# Patient Record
Sex: Female | Born: 1955 | Race: White | Hispanic: No | Marital: Single | Smoking: Never smoker
Health system: Southern US, Community
[De-identification: ages and names within clinical notes are randomized; demographics above are authoritative.]

## PROBLEM LIST (undated history)

## (undated) DIAGNOSIS — M810 Age-related osteoporosis without current pathological fracture: Secondary | ICD-10-CM

## (undated) DIAGNOSIS — M1990 Unspecified osteoarthritis, unspecified site: Secondary | ICD-10-CM

## (undated) HISTORY — DX: Age-related osteoporosis without current pathological fracture: M81.0

## (undated) HISTORY — DX: Unspecified osteoarthritis, unspecified site: M19.90

---

## 1998-03-30 ENCOUNTER — Encounter: Payer: Self-pay | Admitting: Obstetrics & Gynecology

## 1998-03-30 ENCOUNTER — Ambulatory Visit (HOSPITAL_COMMUNITY): Admission: RE | Admit: 1998-03-30 | Discharge: 1998-03-30 | Payer: Self-pay | Admitting: Obstetrics & Gynecology

## 1999-03-29 ENCOUNTER — Ambulatory Visit (HOSPITAL_COMMUNITY): Admission: RE | Admit: 1999-03-29 | Discharge: 1999-03-29 | Payer: Self-pay | Admitting: Obstetrics and Gynecology

## 1999-03-29 ENCOUNTER — Encounter: Payer: Self-pay | Admitting: Obstetrics and Gynecology

## 2000-05-08 ENCOUNTER — Other Ambulatory Visit: Admission: RE | Admit: 2000-05-08 | Discharge: 2000-05-08 | Payer: Self-pay | Admitting: Obstetrics and Gynecology

## 2001-07-05 ENCOUNTER — Other Ambulatory Visit: Admission: RE | Admit: 2001-07-05 | Discharge: 2001-07-05 | Payer: Self-pay | Admitting: Obstetrics and Gynecology

## 2002-08-01 ENCOUNTER — Other Ambulatory Visit: Admission: RE | Admit: 2002-08-01 | Discharge: 2002-08-01 | Payer: Self-pay | Admitting: Obstetrics and Gynecology

## 2003-09-11 ENCOUNTER — Other Ambulatory Visit: Admission: RE | Admit: 2003-09-11 | Discharge: 2003-09-11 | Payer: Self-pay | Admitting: Obstetrics and Gynecology

## 2004-11-01 ENCOUNTER — Other Ambulatory Visit: Admission: RE | Admit: 2004-11-01 | Discharge: 2004-11-01 | Payer: Self-pay | Admitting: Obstetrics and Gynecology

## 2005-02-24 HISTORY — PX: TRIGGER FINGER RELEASE: SHX641

## 2005-11-07 ENCOUNTER — Other Ambulatory Visit: Admission: RE | Admit: 2005-11-07 | Discharge: 2005-11-07 | Payer: Self-pay | Admitting: Obstetrics and Gynecology

## 2007-07-29 ENCOUNTER — Encounter: Payer: Self-pay | Admitting: Gastroenterology

## 2007-08-11 ENCOUNTER — Ambulatory Visit: Payer: Self-pay | Admitting: Gastroenterology

## 2007-08-24 ENCOUNTER — Ambulatory Visit: Payer: Self-pay | Admitting: Gastroenterology

## 2007-11-12 ENCOUNTER — Ambulatory Visit: Payer: Self-pay | Admitting: Vascular Surgery

## 2007-12-29 ENCOUNTER — Ambulatory Visit: Payer: Self-pay | Admitting: Vascular Surgery

## 2008-02-16 ENCOUNTER — Ambulatory Visit: Payer: Self-pay | Admitting: Vascular Surgery

## 2010-07-09 NOTE — Assessment & Plan Note (Signed)
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  This  was done on 12/29/2007.  She has a very nice result from this.  She  reports that the pain over these areas has completely resolved.  She has  had a very nice cosmetic result as well with the reticular varicosities  being sclerosed.  She is pleased with her result and will notify us  should she wish any further sclerotherapy treatments.   Larina Earthly, M.D.  Electronically Signed   TFE/MEDQ  D:  02/16/2008  T:  02/17/2008  Job:  [id number]

## 2010-07-09 NOTE — Assessment & Plan Note (Signed)
OFFICE VISIT   JERMIYA, REICHL  DOB:  15-Feb-1956                                       12/29/2007  ZOXWR#:[id number]   The patient presents today for sclerotherapy for painful reticular  varicosities over her left lateral thigh extending down onto her calf.  She had a total of 4 mL solution of 0.3% of sodium tetradecyl mixed half  and half with CO2 over the multiple reticular.  She had a very nice  initial result.  She is instructed on her compression garment use, and  that we will see her again in 3 weeks for continued followup.   Larina Earthly, M.D.  Electronically Signed   TFE/MEDQ  D:  12/29/2007  T:  12/30/2007  Job:  [id number]   cc:   Stacie Acres. Cliffton Asters, M.D.

## 2010-07-09 NOTE — Consult Note (Signed)
NEW PATIENT CONSULTATION   Beth Wood, Beth Wood  DOB:  1955/06/09                                       11/12/2007  ZOXWR#:[id number]   The patient presents today for evaluation of lower extremity  varicosities.  She has a very long family history of severe venous  disease.  She has had this is most pronounced in her mother.  She has an  area over the past year on her posterior left calf and thigh with a  slight reticular bulge.  She reports this causes pain and throbbing over  this area.  She has not had any history of deep venous thrombosis.  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  She is concerned  regarding these reticular varicosities.   PAST HISTORY:  Otherwise benign.  She does not have any history of  diabetes, hypertension or other major medical difficulties.   CURRENT MEDICATIONS:  Yaz, Actonel, and calcium.   She has allergy to penicillin.   PHYSICAL EXAM:  A well-developed thin white female in no acute distress.  She does have 2+ dorsalis pedis pulses bilaterally.  She does have this  area of reticular varicosities in her posterior calf extending up onto  her thigh.   I imaged this with ultrasound.  This does not appear to connect to her  small saphenous vein on great saphenous vein.  Her great saphenous and  small saphenous veins are not dilated.  I discussed the significance of  this at length with the patient.  I explained that with no gross reflux  apparent that I would recommend initially treating this simply with  sclerotherapy to treat the reticular veins and hopefully improve her  symptoms.  Explained this is an outpatient procedure, and if she wished  to proceed with this at her convenience in the next several weeks.   Larina Earthly, M.D.  Electronically Signed   TFE/MEDQ  D:  11/12/2007  T:  11/16/2007  Job:  [id number]

## 2013-11-19 ENCOUNTER — Encounter: Payer: Self-pay | Admitting: Gastroenterology

## 2016-04-28 ENCOUNTER — Ambulatory Visit (INDEPENDENT_AMBULATORY_CARE_PROVIDER_SITE_OTHER): Payer: Self-pay

## 2016-04-28 ENCOUNTER — Encounter (INDEPENDENT_AMBULATORY_CARE_PROVIDER_SITE_OTHER): Payer: Self-pay | Admitting: Orthopedic Surgery

## 2016-04-28 ENCOUNTER — Ambulatory Visit (INDEPENDENT_AMBULATORY_CARE_PROVIDER_SITE_OTHER): Payer: BLUE CROSS/BLUE SHIELD | Admitting: Orthopedic Surgery

## 2016-04-28 VITALS — Ht 61.0 in | Wt 108.0 lb

## 2016-04-28 DIAGNOSIS — M545 Low back pain, unspecified: Secondary | ICD-10-CM | POA: Insufficient documentation

## 2016-04-28 MED ORDER — PREDNISONE 10 MG PO TABS: 20.0000 mg | ORAL_TABLET | Freq: Every day | ORAL | 0 refills | Status: AC

## 2016-04-28 NOTE — Progress Notes (Signed)
Office Visit Note   Patient: Beth Wood           Date of Birth: 05/23/1955           MRN: [id number] Visit Date: 04/28/2016              Requested by: No referring provider defined for this encounter. PCP: No PCP Per Patient  Chief Complaint  Patient presents with  . Lower Back - Pain    HPI: Patient is a 61 y.o female who presents for acute lower back pain ongoing for approximately two weeks. She has pain at the center of her back squatting down trying to clean her bathroom. She started doing exercises she had learned from physical therapy and taking meloxicam and was feeling better. A couple days later patient was raising her leg and now states she can hardly walk. She has pain with walking and laying down. She is an employee at [employer] and she is constantly up and down the ladder. She had to leave work due to her pain. She has pain with start up. She denies numbness and tingling, or radicular pain. Maxcine Ham, RT    Assessment & Plan: Visit Diagnoses:  1. Acute midline low back pain without sciatica     Plan: We'll start her on prednisone 20 mg a day she'll discontinue her anti-inflammatories. Taken prednisone in the morning. Recommended heat with either ThermaCare or heating pad to her lumbar spine. Patient is unable to lift or climb ladders that is required for work and we will take her out of work for at least a week we may adjust this pain and how she resolves her symptoms. There are no radicular symptoms do not feel an MRI scan is needed at this time.  Follow-Up Instructions: Return in about 3 weeks (around 05/19/2016).   Ortho Exam Examination patient is alert oriented no adenopathy well-dressed normal affect normal respiratory effort she has a normal gait. Patient has difficulty getting from a sitting to standing position. She has negative straight leg raise bilaterally good motor strength in all motor groups of both lower extremities. She is tender to  palpation the paraspinous muscles worse on the right side. Her MRI scan was reviewed from 2013 which showed no focal herniated disc with some mild bulging disks. Patient is most tender to palpation in the region of L4-5 paraspinous muscle region ROS: Complete review of systems negative except as noted in the history of present illness Imaging: Xr Lumbar Spine 2-3 Views  Result Date: 04/28/2016 Two-view radiographs lumbar spine shows no evidence of scoliosis she has good disc space the pedicles are symmetrical the foramen are open she does have some mild degenerative changes. There is a normal lordosis.   Labs: No results found for: HGBA1C, ESRSEDRATE, CRP, LABURIC, REPTSTATUS, GRAMSTAIN, CULT, LABORGA  Orders:  Orders Placed This Encounter  Procedures  . XR Lumbar Spine 2-3 Views   Meds ordered this encounter  Medications  . predniSONE (DELTASONE) 10 MG tablet    Sig: Take 2 tablets (20 mg total) by mouth daily with breakfast.    Dispense:  60 tablet    Refill:  0     Procedures: No procedures performed  Clinical Data: No additional findings.  Subjective: Review of Systems  Objective: Vital Signs: Ht 5\' 1"  (1.549 m)   Wt 108 lb (49 kg)   BMI 20.41 kg/m   Specialty Comments:  No specialty comments available.  PMFS History: Patient Active Problem  List   Diagnosis Date Noted  . Acute midline low back pain without sciatica 04/28/2016   History reviewed. No pertinent past medical history.  History reviewed. No pertinent family history.  History reviewed. No pertinent surgical history. Social History   Occupational History  . Not on file.   Social History Main Topics  . Smoking status: Never Smoker  . Smokeless tobacco: Never Used  . Alcohol use Not on file  . Drug use: Unknown  . Sexual activity: Not on file

## 2016-05-19 ENCOUNTER — Encounter (INDEPENDENT_AMBULATORY_CARE_PROVIDER_SITE_OTHER): Payer: Self-pay | Admitting: Orthopedic Surgery

## 2016-05-19 ENCOUNTER — Ambulatory Visit (INDEPENDENT_AMBULATORY_CARE_PROVIDER_SITE_OTHER): Payer: BLUE CROSS/BLUE SHIELD | Admitting: Orthopedic Surgery

## 2016-05-19 DIAGNOSIS — M545 Low back pain, unspecified: Secondary | ICD-10-CM

## 2016-05-20 NOTE — Progress Notes (Signed)
   Office Visit Note   Patient: Beth Wood           Date of Birth: [birth year]           MRN: [id number] Visit Date: 05/19/2016              Requested by: No referring provider defined for this encounter. PCP: No PCP Per Patient  Chief Complaint  Patient presents with  . Lower Back - Follow-up    HPI: Patient is a 61 year old woman seen today in follow up for right sided low back pain. Pain is middle of lumbar area in muscles. Is aggravated by movement. Feeling much better. Has improved but is not gone. Has been taking prednisone, states has been tapering dose. Has been able to return to work. Feels the thermacare is helpful. Pain worse with first awakening, stiffness.   Assessment & Plan: Visit Diagnoses:  1. Acute midline low back pain without sciatica     Plan: will continue with conservative measures, heat and ibu. Taper off prednisone. Follow up in office as needed.   Follow-Up Instructions: Return if symptoms worsen or fail to improve.   Physical Exam  Constitutional: Appears well-developed.  Head: Normocephalic.  Eyes: EOM are normal.  Neck: Normal range of motion.  Cardiovascular: Normal rate.   Pulmonary/Chest: Effort normal.  Neurological: Is alert.  Skin: Skin is warm.  Psychiatric: Has a normal mood and affect. Steady gait.  Back Exam   Tenderness  The patient is experiencing tenderness in the lumbar (no spinous process tenderness).  Tests  Straight leg raise right: negative Straight leg raise left: negative    paraspinous on right  Imaging: No results found.  Labs: No results found for: HGBA1C, ESRSEDRATE, CRP, LABURIC, REPTSTATUS, GRAMSTAIN, CULT, LABORGA  Orders:  No orders of the defined types were placed in this encounter.  No orders of the defined types were placed in this encounter.    Procedures: No procedures performed  Clinical Data: No additional findings.  ROS: Review of Systems  Musculoskeletal: Positive for back  pain. Negative for gait problem.  Neurological: Negative for weakness and numbness.    Objective: Vital Signs: There were no vitals taken for this visit.  Specialty Comments:  No specialty comments available.  PMFS History: Patient Active Problem List   Diagnosis Date Noted  . Acute midline low back pain without sciatica 04/28/2016   History reviewed. No pertinent past medical history.  History reviewed. No pertinent family history.  History reviewed. No pertinent surgical history. Social History   Occupational History  . Not on file.   Social History Main Topics  . Smoking status: Never Smoker  . Smokeless tobacco: Never Used  . Alcohol use Not on file  . Drug use: Unknown  . Sexual activity: Not on file

## 2016-06-13 DIAGNOSIS — M810 Age-related osteoporosis without current pathological fracture: Secondary | ICD-10-CM | POA: Diagnosis not present

## 2016-07-10 DIAGNOSIS — Z0000 Encounter for general adult medical examination without abnormal findings: Secondary | ICD-10-CM | POA: Diagnosis not present

## 2016-07-10 DIAGNOSIS — Z130 Encounter for screening for diseases of the blood and blood-forming organs and certain disorders involving the immune mechanism: Secondary | ICD-10-CM | POA: Diagnosis not present

## 2016-07-10 DIAGNOSIS — Z01419 Encounter for gynecological examination (general) (routine) without abnormal findings: Secondary | ICD-10-CM | POA: Diagnosis not present

## 2016-07-10 DIAGNOSIS — Z1389 Encounter for screening for other disorder: Secondary | ICD-10-CM | POA: Diagnosis not present

## 2016-07-10 DIAGNOSIS — Z1231 Encounter for screening mammogram for malignant neoplasm of breast: Secondary | ICD-10-CM | POA: Diagnosis not present

## 2016-07-10 DIAGNOSIS — Z6820 Body mass index (BMI) 20.0-20.9, adult: Secondary | ICD-10-CM | POA: Diagnosis not present

## 2016-08-25 ENCOUNTER — Encounter: Payer: Self-pay | Admitting: Physician Assistant

## 2016-08-25 ENCOUNTER — Ambulatory Visit (INDEPENDENT_AMBULATORY_CARE_PROVIDER_SITE_OTHER): Payer: BLUE CROSS/BLUE SHIELD

## 2016-08-25 ENCOUNTER — Ambulatory Visit (INDEPENDENT_AMBULATORY_CARE_PROVIDER_SITE_OTHER): Payer: BLUE CROSS/BLUE SHIELD | Admitting: Physician Assistant

## 2016-08-25 VITALS — BP 123/74 | HR 63 | Temp 98.4°F | Resp 16 | Ht 61.0 in | Wt 107.2 lb

## 2016-08-25 DIAGNOSIS — M19041 Primary osteoarthritis, right hand: Secondary | ICD-10-CM | POA: Diagnosis not present

## 2016-08-25 DIAGNOSIS — M7989 Other specified soft tissue disorders: Secondary | ICD-10-CM

## 2016-08-25 MED ORDER — COLCHICINE 0.6 MG PO TABS: ORAL_TABLET | ORAL | 1 refills | Status: DC

## 2016-08-25 MED ORDER — SULFAMETHOXAZOLE-TRIMETHOPRIM 800-160 MG PO TABS: 1.0000 | ORAL_TABLET | Freq: Two times a day (BID) | ORAL | 0 refills | Status: DC

## 2016-08-25 NOTE — Patient Instructions (Signed)
     IF you received an x-ray today, you will receive an invoice from [facility]. Please contact [facility] at [phone] with questions or concerns regarding your invoice.   IF you received labwork today, you will receive an invoice from [facility]. Please contact [facility] at [phone] with questions or concerns regarding your invoice.   Our billing staff will not be able to assist you with questions regarding bills from these companies.  You will be contacted with the lab results as soon as they are available. The fastest way to get your results is to activate your My Chart account. Instructions are located on the last page of this paperwork. If you have not heard from us regarding the results in 2 weeks, please contact this office.     

## 2016-08-25 NOTE — Progress Notes (Signed)
    08/25/2016 6:28 PM   DOB: 03-04-1955 / MRN: [id number]  SUBJECTIVE:  Beth Wood is a 61 y.o. female presenting for a swollen and painful right thumb.  Tells me this started about three weeks ago and the problem is waxing and waning.  Associates swelling of the thumb.  She denies weakness of the joint. Tell me the thumb feels like "it has a fever."  She is allergic to penicillins.   She  has no past medical history on file.    She  reports that she has never smoked. She has never used smokeless tobacco. She  has no sexual activity history on file. The patient  has no past surgical history on file.  Her family history is not on file.  Review of Systems  Constitutional: Negative for chills, diaphoresis and fever.  Eyes: Negative.   Gastrointestinal: Negative for nausea.  Skin: Negative for rash.  Neurological: Negative for dizziness, sensory change, speech change, focal weakness and headaches.    The problem list and medications were reviewed and updated by myself where necessary and exist elsewhere in the encounter.   OBJECTIVE:  BP 123/74 (BP Location: Right Arm, Patient Position: Sitting, Cuff Size: Normal)   Pulse 63   Temp 98.4 F (36.9 C) (Oral)   Resp 16   Ht 5\' 1"  (1.549 m)   Wt 107 lb 3.2 oz (48.6 kg)   SpO2 99%   BMI 20.26 kg/m   Physical Exam  Constitutional: She is active.  Non-toxic appearance.  Cardiovascular: Normal rate.   Pulmonary/Chest: Effort normal. No tachypnea.  Musculoskeletal: She exhibits tenderness (About right thumb DIP.  Postive for swelling and mild redness.).  Neurological: She is alert.  Skin: Skin is warm and dry. She is not diaphoretic. No pallor.    No results found for this or any previous visit (from the past 72 hour(s)).  No results found.  ASSESSMENT AND PLAN:  Beth Wood was seen today for hand pain.  Diagnoses and all orders for this visit:  Swelling of right thumb: Gout vs. Infection vs other rheumatological  manifestation.  Will try colchine and abx and sed rate is pending.  -     DG Finger Thumb Right; Future -     Basic metabolic panel -     CBC with Differential -     colchicine 0.6 MG tablet; Take 2 tabs once then 1 tab an hour later. -     Uric Acid -     sulfamethoxazole-trimethoprim (BACTRIM DS,SEPTRA DS) 800-160 MG tablet; Take 1 tablet by mouth 2 (two) times daily. -     Sedimentation Rate    The patient is advised to call or return to clinic if she does not see an improvement in symptoms, or to seek the care of the closest emergency department if she worsens with the above plan.   Philis Fendt, MHS, PA-C [facility] 08/25/2016 6:28 PM

## 2016-08-26 LAB — CBC WITH DIFFERENTIAL/PLATELET
BASOS ABS: 0 10*3/uL (ref 0.0–0.2)
Basos: 1 %
EOS (ABSOLUTE): 0.2 10*3/uL (ref 0.0–0.4)
Eos: 3 %
HEMOGLOBIN: 13.5 g/dL (ref 11.1–15.9)
Hematocrit: 40.5 % (ref 34.0–46.6)
IMMATURE GRANS (ABS): 0 10*3/uL (ref 0.0–0.1)
IMMATURE GRANULOCYTES: 0 %
LYMPHS: 38 %
Lymphocytes Absolute: 2.7 10*3/uL (ref 0.7–3.1)
MCH: 31 pg (ref 26.6–33.0)
MCHC: 33.3 g/dL (ref 31.5–35.7)
MCV: 93 fL (ref 79–97)
MONOCYTES: 10 %
Monocytes Absolute: 0.7 10*3/uL (ref 0.1–0.9)
NEUTROS ABS: 3.5 10*3/uL (ref 1.4–7.0)
NEUTROS PCT: 48 %
Platelets: 326 10*3/uL (ref 150–379)
RBC: 4.35 x10E6/uL (ref 3.77–5.28)
RDW: 12.7 % (ref 12.3–15.4)
WBC: 7.2 10*3/uL (ref 3.4–10.8)

## 2016-08-26 LAB — BASIC METABOLIC PANEL
BUN/Creatinine Ratio: 22 (ref 12–28)
BUN: 13 mg/dL (ref 8–27)
CALCIUM: 9.6 mg/dL (ref 8.7–10.3)
CHLORIDE: 100 mmol/L (ref 96–106)
CO2: 23 mmol/L (ref 20–29)
CREATININE: 0.58 mg/dL (ref 0.57–1.00)
GFR calc Af Amer: 116 mL/min/{1.73_m2} (ref >59)
GFR calc non Af Amer: 101 mL/min/{1.73_m2} (ref >59)
GLUCOSE: 84 mg/dL (ref 65–99)
Potassium: 4.4 mmol/L (ref 3.5–5.2)
Sodium: 142 mmol/L (ref 134–144)

## 2016-08-26 LAB — URIC ACID: URIC ACID: 4 mg/dL (ref 2.5–7.1)

## 2016-08-26 LAB — SEDIMENTATION RATE: SED RATE: 14 mm/h (ref 0–40)

## 2016-08-26 NOTE — Progress Notes (Signed)
Please make patient aware of results via letter. In the context of her overall presentation any abnormal values are of no clinical significance.  Philis Fendt PA-C, 08/26/2016 8:29 AM

## 2016-08-30 ENCOUNTER — Telehealth: Payer: Self-pay | Admitting: Physician Assistant

## 2016-08-30 NOTE — Telephone Encounter (Signed)
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  He did call and advise her of the diagnosis but did not tell her how to treat.  Please advise with further instructions [phone]

## 2016-09-01 NOTE — Telephone Encounter (Signed)
Tylenol 1000 mg is best for maintenance. 200-400 mg ibuprofen on top of the Tylenol is good for break through pain. If the pain pushes through that I can write her tramadol when I get back.

## 2016-09-01 NOTE — Telephone Encounter (Signed)
Please advise 

## 2016-09-01 NOTE — Telephone Encounter (Signed)
Call placed to patient to make her aware of provider response, no answer on patient phone. Left message for patient to return call to this office./ S.Markeem Noreen,CMA

## 2016-09-23 ENCOUNTER — Telehealth: Payer: Self-pay | Admitting: Physician Assistant

## 2016-09-23 DIAGNOSIS — M79646 Pain in unspecified finger(s): Secondary | ICD-10-CM

## 2016-09-23 NOTE — Telephone Encounter (Signed)
Pt called requesting referral for rheumatology for thumb. Pt said she spoke with our office two weeks ago requesting a referral and had not heard back. I do not have any record of speaking with the pt so I am not sure who she spoke to concerning a referral but I do not see a message or referral placed. Please advise. Pt can be contacted at [phone].

## 2016-10-21 DIAGNOSIS — M659 Synovitis and tenosynovitis, unspecified: Secondary | ICD-10-CM | POA: Diagnosis not present

## 2016-10-21 DIAGNOSIS — M79644 Pain in right finger(s): Secondary | ICD-10-CM | POA: Diagnosis not present

## 2017-01-09 ENCOUNTER — Telehealth: Payer: Self-pay | Admitting: Physician Assistant

## 2017-01-09 NOTE — Telephone Encounter (Unsigned)
Copied from [place] [id number]. Topic: Referral - Status >> Jan 09, 2017 12:21 PM Neva Seat wrote: Pt has gotten worse - now it is in pt's other hand.   Pt is still taking anti inflammatory Dr. Jonna Munro prescribed.   Reason for CRM: pt was seen by a orthopedic for her hand, and they wants her referred to a rumatoligist but they need Dr. Carlis Abbott that seen her in August of 2018 to refer her. Please contact pt if you have any questions.

## 2017-01-09 NOTE — Telephone Encounter (Signed)
Please advise 

## 2017-01-19 ENCOUNTER — Telehealth: Payer: Self-pay | Admitting: Physician Assistant

## 2017-01-19 NOTE — Telephone Encounter (Signed)
Pt called stated that her ortho wants her to go to a rheumatologist but she needs a referral for that..  Please advise

## 2017-01-21 NOTE — Telephone Encounter (Signed)
I really don't have any evidence that warrants a rheumtalogical referral. She has OA about the joint she's complaining of and a negative Uric acid and a negative sed rate. I'd be happy to see her back for this but she is already in the hands of one specialist per her report. She should work with them if she wants that referral. I will not be referring.

## 2017-01-22 NOTE — Telephone Encounter (Signed)
Okay I will let the pt know thank you

## 2017-01-22 NOTE — Telephone Encounter (Signed)
Called pt and left a detailed message of what Beth Wood stated and stated that if pt had any questions or concerns to call back.

## 2017-01-22 NOTE — Telephone Encounter (Signed)
Copied from [place] [id number]. Topic: Referral - Status >> Jan 09, 2017 12:21 PM Neva Seat wrote: Pt has gotten worse - now it is in pt's other hand.   Pt is still taking anti inflammatory Dr. Jonna Munro prescribed.   Reason for CRM: pt was seen by a orthopedic for her hand, and they wants her referred to a rumatoligist but they need Dr. Carlis Abbott that seen her in August of 2018 to refer her. Please contact pt if you have any questions. >> Jan 19, 2017 12:28 PM Wynetta Emery, [place] C wrote: PT called in to check the status. Pt says that she has been patiently waiting for a call back . Dr. Carlis Abbott referral area,  please advise.  >> Jan 22, 2017 12:28 PM Robina Ade, Helene Kelp D wrote: Patient called back upset because she feels that she needs a referral to Rheumatology for her hands, they are worse shape. She felt like the provider that saw her did not want to help. Please call patient back, thanks.

## 2017-01-22 NOTE — Telephone Encounter (Signed)
Lets have her come back in. Philis Fendt, MS, PA-C 1:52 PM, 01/22/2017

## 2017-01-23 NOTE — Telephone Encounter (Signed)
Called pt, LMOVM to call and make appt with Philis Fendt PA

## 2017-02-11 ENCOUNTER — Ambulatory Visit
Admission: RE | Admit: 2017-02-11 | Discharge: 2017-02-11 | Disposition: A | Discharge status: Home / Self Care | Payer: BLUE CROSS/BLUE SHIELD | Source: Ambulatory Visit | Attending: Family Medicine | Admitting: Family Medicine

## 2017-02-11 ENCOUNTER — Other Ambulatory Visit: Payer: Self-pay | Admitting: Family Medicine

## 2017-02-11 DIAGNOSIS — M7989 Other specified soft tissue disorders: Secondary | ICD-10-CM | POA: Diagnosis not present

## 2017-02-11 DIAGNOSIS — M189 Osteoarthritis of first carpometacarpal joint, unspecified: Secondary | ICD-10-CM | POA: Diagnosis not present

## 2017-03-30 DIAGNOSIS — M1990 Unspecified osteoarthritis, unspecified site: Secondary | ICD-10-CM | POA: Diagnosis not present

## 2017-03-30 DIAGNOSIS — M06072 Rheumatoid arthritis without rheumatoid factor, left ankle and foot: Secondary | ICD-10-CM | POA: Diagnosis not present

## 2017-03-30 DIAGNOSIS — M79643 Pain in unspecified hand: Secondary | ICD-10-CM | POA: Diagnosis not present

## 2017-03-30 DIAGNOSIS — M7989 Other specified soft tissue disorders: Secondary | ICD-10-CM | POA: Diagnosis not present

## 2017-03-30 DIAGNOSIS — M06071 Rheumatoid arthritis without rheumatoid factor, right ankle and foot: Secondary | ICD-10-CM | POA: Diagnosis not present

## 2017-03-30 DIAGNOSIS — M79673 Pain in unspecified foot: Secondary | ICD-10-CM | POA: Diagnosis not present

## 2017-04-13 DIAGNOSIS — M1990 Unspecified osteoarthritis, unspecified site: Secondary | ICD-10-CM | POA: Diagnosis not present

## 2017-05-14 DIAGNOSIS — M1990 Unspecified osteoarthritis, unspecified site: Secondary | ICD-10-CM | POA: Diagnosis not present

## 2017-06-30 DIAGNOSIS — M79643 Pain in unspecified hand: Secondary | ICD-10-CM | POA: Diagnosis not present

## 2017-06-30 DIAGNOSIS — M79673 Pain in unspecified foot: Secondary | ICD-10-CM | POA: Diagnosis not present

## 2017-06-30 DIAGNOSIS — M7989 Other specified soft tissue disorders: Secondary | ICD-10-CM | POA: Diagnosis not present

## 2017-06-30 DIAGNOSIS — M810 Age-related osteoporosis without current pathological fracture: Secondary | ICD-10-CM | POA: Diagnosis not present

## 2017-06-30 DIAGNOSIS — M1990 Unspecified osteoarthritis, unspecified site: Secondary | ICD-10-CM | POA: Diagnosis not present

## 2017-07-14 ENCOUNTER — Encounter: Payer: Self-pay | Admitting: Gastroenterology

## 2017-07-27 ENCOUNTER — Encounter: Payer: Self-pay | Admitting: Gastroenterology

## 2017-07-31 DIAGNOSIS — Z130 Encounter for screening for diseases of the blood and blood-forming organs and certain disorders involving the immune mechanism: Secondary | ICD-10-CM | POA: Diagnosis not present

## 2017-07-31 DIAGNOSIS — Z124 Encounter for screening for malignant neoplasm of cervix: Secondary | ICD-10-CM | POA: Diagnosis not present

## 2017-07-31 DIAGNOSIS — Z01419 Encounter for gynecological examination (general) (routine) without abnormal findings: Secondary | ICD-10-CM | POA: Diagnosis not present

## 2017-07-31 DIAGNOSIS — Z1231 Encounter for screening mammogram for malignant neoplasm of breast: Secondary | ICD-10-CM | POA: Diagnosis not present

## 2017-07-31 DIAGNOSIS — Z1389 Encounter for screening for other disorder: Secondary | ICD-10-CM | POA: Diagnosis not present

## 2017-07-31 DIAGNOSIS — Z6820 Body mass index (BMI) 20.0-20.9, adult: Secondary | ICD-10-CM | POA: Diagnosis not present

## 2017-08-11 DIAGNOSIS — M810 Age-related osteoporosis without current pathological fracture: Secondary | ICD-10-CM | POA: Diagnosis not present

## 2017-08-11 DIAGNOSIS — M7989 Other specified soft tissue disorders: Secondary | ICD-10-CM | POA: Diagnosis not present

## 2017-08-11 DIAGNOSIS — M79673 Pain in unspecified foot: Secondary | ICD-10-CM | POA: Diagnosis not present

## 2017-08-11 DIAGNOSIS — M79643 Pain in unspecified hand: Secondary | ICD-10-CM | POA: Diagnosis not present

## 2017-08-11 DIAGNOSIS — M1990 Unspecified osteoarthritis, unspecified site: Secondary | ICD-10-CM | POA: Diagnosis not present

## 2017-09-09 ENCOUNTER — Ambulatory Visit (AMBULATORY_SURGERY_CENTER): Payer: Self-pay

## 2017-09-09 VITALS — Ht 61.0 in | Wt 107.6 lb

## 2017-09-09 DIAGNOSIS — Z1211 Encounter for screening for malignant neoplasm of colon: Secondary | ICD-10-CM

## 2017-09-09 MED ORDER — PEG-KCL-NACL-NASULF-NA ASC-C 140 G PO SOLR: 1.0000 | Freq: Once | ORAL | Status: AC

## 2017-09-09 NOTE — Progress Notes (Signed)
Per pt, no allergies to soy or egg products.Pt not taking any weight loss meds or using  O2 at home.  Pt refused emmi video. 

## 2017-09-22 ENCOUNTER — Encounter: Payer: Self-pay | Admitting: Gastroenterology

## 2017-09-25 ENCOUNTER — Encounter: Payer: Self-pay | Admitting: Gastroenterology

## 2017-09-25 ENCOUNTER — Ambulatory Visit (AMBULATORY_SURGERY_CENTER): Payer: BLUE CROSS/BLUE SHIELD | Admitting: Gastroenterology

## 2017-09-25 VITALS — BP 110/63 | HR 66 | Temp 98.9°F | Resp 15 | Ht 61.0 in | Wt 107.0 lb

## 2017-09-25 DIAGNOSIS — Z1211 Encounter for screening for malignant neoplasm of colon: Secondary | ICD-10-CM | POA: Diagnosis not present

## 2017-09-25 DIAGNOSIS — D120 Benign neoplasm of cecum: Secondary | ICD-10-CM | POA: Diagnosis not present

## 2017-09-25 DIAGNOSIS — Z86010 Personal history of colonic polyps: Secondary | ICD-10-CM | POA: Diagnosis not present

## 2017-09-25 MED ORDER — SODIUM CHLORIDE 0.9 % IV SOLN: 500.0000 mL | Freq: Once | INTRAVENOUS | Status: AC

## 2017-09-25 NOTE — Patient Instructions (Signed)
Information on polyps given.   YOU HAD AN ENDOSCOPIC PROCEDURE TODAY AT THE [facility]:   Refer to the procedure report that was given to you for any specific questions about what was found during the examination.  If the procedure report does not answer your questions, please call your gastroenterologist to clarify.  If you requested that your care partner not be given the details of your procedure findings, then the procedure report has been included in a sealed envelope for you to review at your convenience later.  YOU SHOULD EXPECT: Some feelings of bloating in the abdomen. Passage of more gas than usual.  Walking can help get rid of the air that was put into your GI tract during the procedure and reduce the bloating. If you had a lower endoscopy (such as a colonoscopy or flexible sigmoidoscopy) you may notice spotting of blood in your stool or on the toilet paper. If you underwent a bowel prep for your procedure, you may not have a normal bowel movement for a few days.  Please Note:  You might notice some irritation and congestion in your nose or some drainage.  This is from the oxygen used during your procedure.  There is no need for concern and it should clear up in a day or so.  SYMPTOMS TO REPORT IMMEDIATELY:   Following lower endoscopy (colonoscopy or flexible sigmoidoscopy):  Excessive amounts of blood in the stool  Significant tenderness or worsening of abdominal pains  Swelling of the abdomen that is new, acute  Fever of 100F or higher     For urgent or emergent issues, a gastroenterologist can be reached at any hour by calling [phone].   DIET:  We do recommend a small meal at first, but then you may proceed to your regular diet.  Drink plenty of fluids but you should avoid alcoholic beverages for 24 hours.  ACTIVITY:  You should plan to take it easy for the rest of today and you should NOT DRIVE or use heavy machinery until tomorrow (because of the  sedation medicines used during the test).    FOLLOW UP: Our staff will call the number listed on your records the next business day following your procedure to check on you and address any questions or concerns that you may have regarding the information given to you following your procedure. If we do not reach you, we will leave a message.  However, if you are feeling well and you are not experiencing any problems, there is no need to return our call.  We will assume that you have returned to your regular daily activities without incident.  If any biopsies were taken you will be contacted by phone or by letter within the next 1-3 weeks.  Please call us at [phone] if you have not heard about the biopsies in 3 weeks.    SIGNATURES/CONFIDENTIALITY: You and/or your care partner have signed paperwork which will be entered into your electronic medical record.  These signatures attest to the fact that that the information above on your After Visit Summary has been reviewed and is understood.  Full responsibility of the confidentiality of this discharge information lies with you and/or your care-partner. 

## 2017-09-25 NOTE — Op Note (Signed)
[place] Patient Name: Beth Wood Procedure Date: 09/25/2017 7:59 AM MRN: [id number] Endoscopist: Mallie Mussel L. Loletha Carrow , MD Age: 62 Referring MD:  Date of Birth: 16-Jan-1956 Gender: Female Account #: [id number] Procedure:                Colonoscopy Indications:              Screening for colorectal malignant neoplasm (no                            polyps 07/2007) Medicines:                Monitored Anesthesia Care Procedure:                Pre-Anesthesia Assessment:                           - Prior to the procedure, a History and Physical                            was performed, and patient medications and                            allergies were reviewed. The patient's tolerance of                            previous anesthesia was also reviewed. The risks                            and benefits of the procedure and the sedation                            options and risks were discussed with the patient.                            All questions were answered, and informed consent                            was obtained. Prior Anticoagulants: The patient has                            taken no previous anticoagulant or antiplatelet                            agents. ASA Grade Assessment: II - A patient with                            mild systemic disease. After reviewing the risks                            and benefits, the patient was deemed in                            satisfactory condition to undergo the procedure.  After obtaining informed consent, the colonoscope                            was passed under direct vision. Throughout the                            procedure, the patient's blood pressure, pulse, and                            oxygen saturations were monitored continuously. The                            Colonoscope was introduced through the anus and                            advanced to the the cecum, identified by                     appendiceal orifice and ileocecal valve. The                            colonoscopy was performed without difficulty. The                            patient tolerated the procedure well. The quality                            of the bowel preparation was excellent. The                            ileocecal valve, appendiceal orifice, and rectum                            were photographed. The quality of the bowel                            preparation was evaluated using the BBPS [facility]                            Bowel Preparation Scale) with scores of: Right                            Colon = 3, Transverse Colon = 3 and Left Colon = 3                            (entire mucosa seen well with no residual staining,                            small fragments of stool or opaque liquid). The                            total BBPS score equals 9. The bowel preparation  used was Plenvu. Scope In: 8:07:39 AM Scope Out: 8:24:30 AM Scope Withdrawal Time: 0 hours 13 minutes 28 seconds  Total Procedure Duration: 0 hours 16 minutes 51 seconds  Findings:                 The perianal and digital rectal examinations were                            normal.                           A 2 mm polyp was found in the cecum. The polyp was                            sessile. The polyp was removed with a cold biopsy                            forceps. Resection and retrieval were complete.                           A single medium-mouthed diverticulum was found in                            the right colon.                           The exam was otherwise without abnormality on                            direct and retroflexion views. Complications:            No immediate complications. Estimated Blood Loss:     Estimated blood loss was minimal. Impression:               - One 2 mm polyp in the cecum, removed with a cold                            biopsy forceps.  Resected and retrieved.                           - Diverticulosis in the right colon.                           - The examination was otherwise normal on direct                            and retroflexion views. Recommendation:           - Patient has a contact number available for                            emergencies. The signs and symptoms of potential                            delayed complications were discussed with the  patient. Return to normal activities tomorrow.                            Written discharge instructions were provided to the                            patient.                           - Resume previous diet.                           - Continue present medications.                           - Await pathology results.                           - Repeat colonoscopy is recommended for                            surveillance. The colonoscopy date will be                            determined after pathology results from today's                            exam become available for review. Eann Cleland L. Loletha Carrow, MD 09/25/2017 8:27:10 AM This report has been signed electronically.

## 2017-09-25 NOTE — Progress Notes (Signed)
Called to room to assist during endoscopic procedure.  Patient ID and intended procedure confirmed with present staff. Received instructions for my participation in the procedure from the performing physician.  

## 2017-09-25 NOTE — Progress Notes (Signed)
Report given to PACU, vss 

## 2017-09-25 NOTE — Progress Notes (Signed)
Pt. Reports no change in her medical or surgical history since her pre-visit 09/09/2017.

## 2017-09-27 DIAGNOSIS — M79672 Pain in left foot: Secondary | ICD-10-CM | POA: Diagnosis not present

## 2017-09-28 ENCOUNTER — Telehealth: Payer: Self-pay | Admitting: *Deleted

## 2017-09-28 NOTE — Telephone Encounter (Signed)
  Follow up Call-  Call back number 09/25/2017  Post procedure Call Back phone  # [phone]  Permission to leave phone message Yes  Some recent data might be hidden    The above number in a work number.  I called pt's home Patient questions:  Do you have a fever, pain , or abdominal swelling? No. Pain Score  0 *  Have you tolerated food without any problems? Yes.    Have you been able to return to your normal activities? Yes.    Do you have any questions about your discharge instructions: Diet   No. Medications  No. Follow up visit  No.  Do you have questions or concerns about your Care? No.  Actions: * If pain score is 4 or above: No action needed, pain <4.

## 2017-10-02 ENCOUNTER — Encounter: Payer: Self-pay | Admitting: Gastroenterology

## 2017-10-13 DIAGNOSIS — M25572 Pain in left ankle and joints of left foot: Secondary | ICD-10-CM | POA: Diagnosis not present

## 2017-10-15 ENCOUNTER — Other Ambulatory Visit: Payer: Self-pay | Admitting: Family Medicine

## 2017-10-16 ENCOUNTER — Other Ambulatory Visit: Payer: Self-pay | Admitting: Family Medicine

## 2017-10-16 DIAGNOSIS — M25572 Pain in left ankle and joints of left foot: Secondary | ICD-10-CM

## 2017-10-23 ENCOUNTER — Ambulatory Visit
Admission: RE | Admit: 2017-10-23 | Discharge: 2017-10-23 | Disposition: A | Discharge status: Home / Self Care | Payer: BLUE CROSS/BLUE SHIELD | Source: Ambulatory Visit | Attending: Family Medicine | Admitting: Family Medicine

## 2017-10-23 DIAGNOSIS — M25572 Pain in left ankle and joints of left foot: Secondary | ICD-10-CM

## 2017-10-23 DIAGNOSIS — R600 Localized edema: Secondary | ICD-10-CM | POA: Diagnosis not present

## 2017-11-16 DIAGNOSIS — M7989 Other specified soft tissue disorders: Secondary | ICD-10-CM | POA: Diagnosis not present

## 2017-11-16 DIAGNOSIS — M0609 Rheumatoid arthritis without rheumatoid factor, multiple sites: Secondary | ICD-10-CM | POA: Diagnosis not present

## 2017-11-16 DIAGNOSIS — M79643 Pain in unspecified hand: Secondary | ICD-10-CM | POA: Diagnosis not present

## 2017-11-17 DIAGNOSIS — M79672 Pain in left foot: Secondary | ICD-10-CM | POA: Diagnosis not present

## 2017-11-17 DIAGNOSIS — M84375A Stress fracture, left foot, initial encounter for fracture: Secondary | ICD-10-CM | POA: Diagnosis not present

## 2017-12-10 DIAGNOSIS — M0609 Rheumatoid arthritis without rheumatoid factor, multiple sites: Secondary | ICD-10-CM | POA: Diagnosis not present

## 2017-12-24 DIAGNOSIS — M810 Age-related osteoporosis without current pathological fracture: Secondary | ICD-10-CM | POA: Diagnosis not present

## 2017-12-24 DIAGNOSIS — M0609 Rheumatoid arthritis without rheumatoid factor, multiple sites: Secondary | ICD-10-CM | POA: Diagnosis not present

## 2017-12-28 DIAGNOSIS — Z79899 Other long term (current) drug therapy: Secondary | ICD-10-CM | POA: Diagnosis not present

## 2017-12-28 DIAGNOSIS — M810 Age-related osteoporosis without current pathological fracture: Secondary | ICD-10-CM | POA: Diagnosis not present

## 2017-12-28 DIAGNOSIS — M0609 Rheumatoid arthritis without rheumatoid factor, multiple sites: Secondary | ICD-10-CM | POA: Diagnosis not present

## 2017-12-28 DIAGNOSIS — M79643 Pain in unspecified hand: Secondary | ICD-10-CM | POA: Diagnosis not present

## 2018-01-20 DIAGNOSIS — M06871 Other specified rheumatoid arthritis, right ankle and foot: Secondary | ICD-10-CM | POA: Diagnosis not present

## 2018-01-20 DIAGNOSIS — M79672 Pain in left foot: Secondary | ICD-10-CM | POA: Diagnosis not present

## 2018-01-26 DIAGNOSIS — M0609 Rheumatoid arthritis without rheumatoid factor, multiple sites: Secondary | ICD-10-CM | POA: Diagnosis not present

## 2018-02-23 DIAGNOSIS — M0609 Rheumatoid arthritis without rheumatoid factor, multiple sites: Secondary | ICD-10-CM | POA: Diagnosis not present

## 2018-03-23 DIAGNOSIS — M0609 Rheumatoid arthritis without rheumatoid factor, multiple sites: Secondary | ICD-10-CM | POA: Diagnosis not present

## 2018-03-31 DIAGNOSIS — M79643 Pain in unspecified hand: Secondary | ICD-10-CM | POA: Diagnosis not present

## 2018-03-31 DIAGNOSIS — Z79899 Other long term (current) drug therapy: Secondary | ICD-10-CM | POA: Diagnosis not present

## 2018-03-31 DIAGNOSIS — M810 Age-related osteoporosis without current pathological fracture: Secondary | ICD-10-CM | POA: Diagnosis not present

## 2018-03-31 DIAGNOSIS — M0609 Rheumatoid arthritis without rheumatoid factor, multiple sites: Secondary | ICD-10-CM | POA: Diagnosis not present

## 2018-04-21 DIAGNOSIS — M0609 Rheumatoid arthritis without rheumatoid factor, multiple sites: Secondary | ICD-10-CM | POA: Diagnosis not present

## 2018-05-06 DIAGNOSIS — M0609 Rheumatoid arthritis without rheumatoid factor, multiple sites: Secondary | ICD-10-CM | POA: Diagnosis not present

## 2018-05-20 DIAGNOSIS — M0609 Rheumatoid arthritis without rheumatoid factor, multiple sites: Secondary | ICD-10-CM | POA: Diagnosis not present

## 2018-06-17 DIAGNOSIS — M0609 Rheumatoid arthritis without rheumatoid factor, multiple sites: Secondary | ICD-10-CM | POA: Diagnosis not present

## 2018-07-15 DIAGNOSIS — M0609 Rheumatoid arthritis without rheumatoid factor, multiple sites: Secondary | ICD-10-CM | POA: Diagnosis not present

## 2018-08-12 DIAGNOSIS — M0609 Rheumatoid arthritis without rheumatoid factor, multiple sites: Secondary | ICD-10-CM | POA: Diagnosis not present

## 2018-08-23 DIAGNOSIS — M0609 Rheumatoid arthritis without rheumatoid factor, multiple sites: Secondary | ICD-10-CM | POA: Diagnosis not present

## 2018-08-23 DIAGNOSIS — M810 Age-related osteoporosis without current pathological fracture: Secondary | ICD-10-CM | POA: Diagnosis not present

## 2018-08-23 DIAGNOSIS — M79643 Pain in unspecified hand: Secondary | ICD-10-CM | POA: Diagnosis not present

## 2018-08-23 DIAGNOSIS — Z79899 Other long term (current) drug therapy: Secondary | ICD-10-CM | POA: Diagnosis not present

## 2018-09-09 DIAGNOSIS — M0609 Rheumatoid arthritis without rheumatoid factor, multiple sites: Secondary | ICD-10-CM | POA: Diagnosis not present

## 2018-09-09 DIAGNOSIS — M810 Age-related osteoporosis without current pathological fracture: Secondary | ICD-10-CM | POA: Diagnosis not present

## 2018-10-11 DIAGNOSIS — M0609 Rheumatoid arthritis without rheumatoid factor, multiple sites: Secondary | ICD-10-CM | POA: Diagnosis not present

## 2018-11-08 DIAGNOSIS — M0609 Rheumatoid arthritis without rheumatoid factor, multiple sites: Secondary | ICD-10-CM | POA: Diagnosis not present

## 2018-11-23 DIAGNOSIS — Z79899 Other long term (current) drug therapy: Secondary | ICD-10-CM | POA: Diagnosis not present

## 2018-11-23 DIAGNOSIS — M810 Age-related osteoporosis without current pathological fracture: Secondary | ICD-10-CM | POA: Diagnosis not present

## 2018-11-23 DIAGNOSIS — M79643 Pain in unspecified hand: Secondary | ICD-10-CM | POA: Diagnosis not present

## 2018-11-23 DIAGNOSIS — M0609 Rheumatoid arthritis without rheumatoid factor, multiple sites: Secondary | ICD-10-CM | POA: Diagnosis not present

## 2018-12-07 DIAGNOSIS — M0609 Rheumatoid arthritis without rheumatoid factor, multiple sites: Secondary | ICD-10-CM | POA: Diagnosis not present

## 2019-01-05 DIAGNOSIS — M0609 Rheumatoid arthritis without rheumatoid factor, multiple sites: Secondary | ICD-10-CM | POA: Diagnosis not present

## 2019-02-03 DIAGNOSIS — M0609 Rheumatoid arthritis without rheumatoid factor, multiple sites: Secondary | ICD-10-CM | POA: Diagnosis not present

## 2019-02-23 DIAGNOSIS — M0609 Rheumatoid arthritis without rheumatoid factor, multiple sites: Secondary | ICD-10-CM | POA: Diagnosis not present

## 2019-02-23 DIAGNOSIS — Z79899 Other long term (current) drug therapy: Secondary | ICD-10-CM | POA: Diagnosis not present

## 2019-02-23 DIAGNOSIS — M79643 Pain in unspecified hand: Secondary | ICD-10-CM | POA: Diagnosis not present

## 2019-02-23 DIAGNOSIS — M810 Age-related osteoporosis without current pathological fracture: Secondary | ICD-10-CM | POA: Diagnosis not present

## 2019-12-15 IMAGING — MR MR FOOT*L* W/O CM
5 series · 37 of 40 positions shown · non-contrast
Comparison: Left foot x-rays dated September 27, 2017.

CLINICAL DATA: Lateral ankle pain radiating to the dorsal midfoot.
Symptoms present for the past 5 weeks.

EXAM:
MRI OF THE LEFT FOOT WITHOUT CONTRAST
TECHNIQUE: Multiplanar, multisequence MR imaging of the left ankle was
performed. No intravenous contrast was administered.

[Series 4: T2 fat-sat · axial · 3.0mm · 0.50mm/px · z∈[-65,+56]mm · 8 of 32 slices shown (1 of 2)]
[im 1/32]
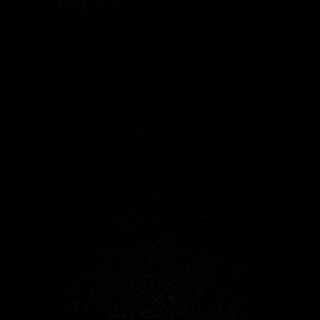
[im 5/32]
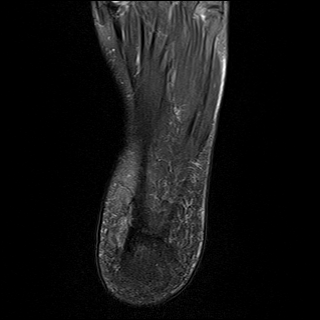
[im 9/32]
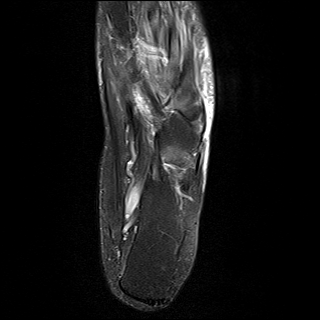
[im 14/32]
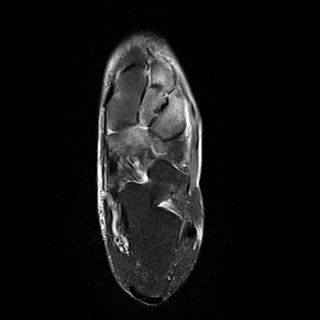
[im 18/32]
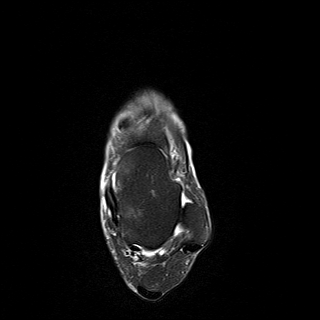
[im 23/32]
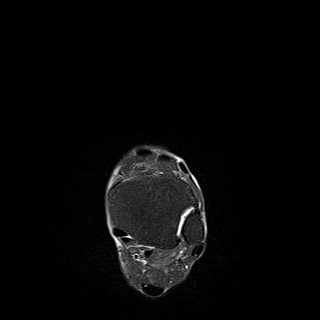
[im 27/32]
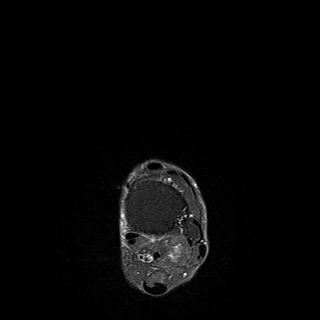
[im 32/32]
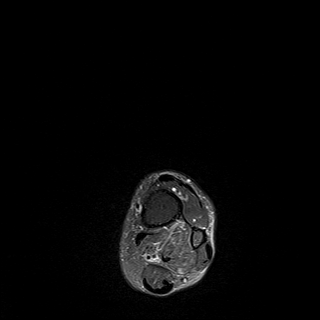

[Series 5: PD fat-sat · axial · 3.0mm · 0.42mm/px · z∈[-65,+56]mm · 9 of 32 slices shown]
[im 1/32]
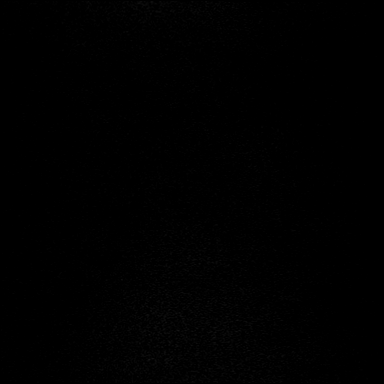
[im 4/32]
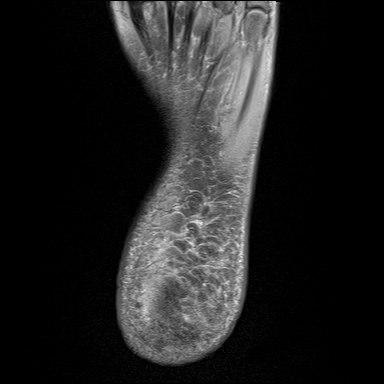
[im 8/32]
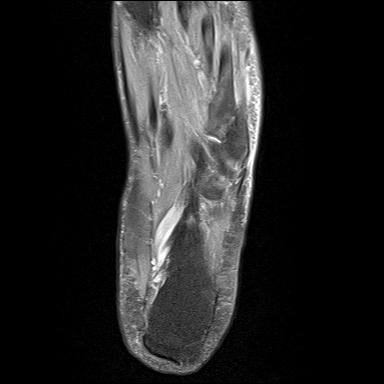
[im 12/32]
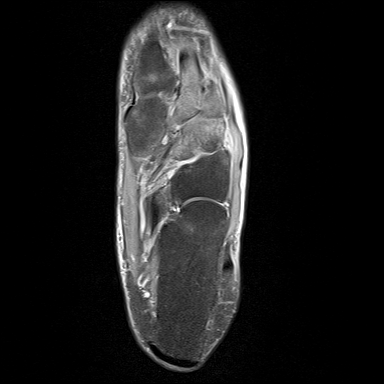
[im 16/32]
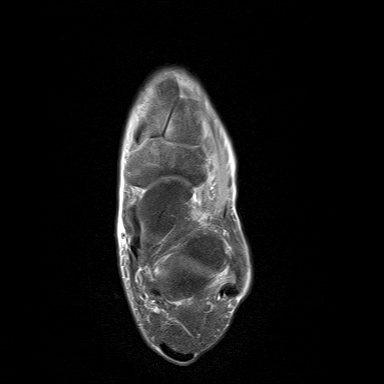
[im 20/32]
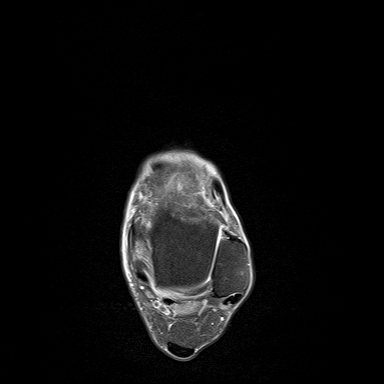
[im 24/32]
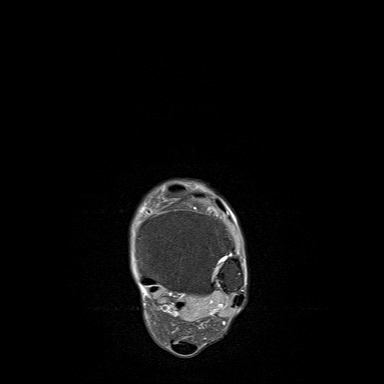
[im 28/32]
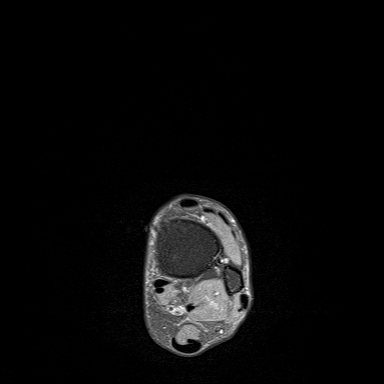
[im 32/32]
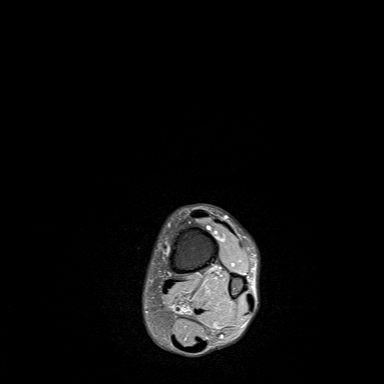

[Series 6: T1 · sagittal · 4.0mm · 0.56mm/px · 6 of 20 slices shown]
[im 1/20]
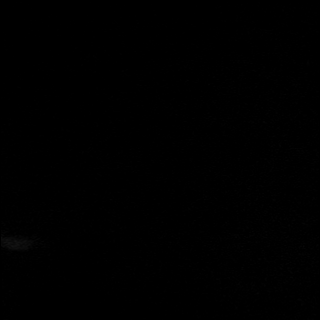
[im 4/20]
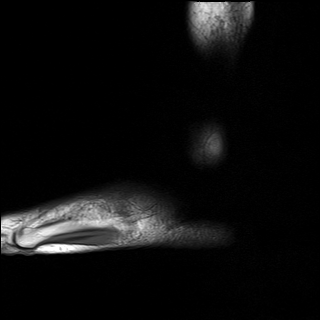
[im 8/20]
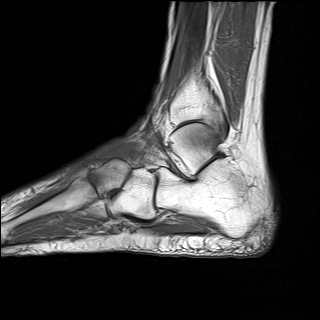
[im 12/20]
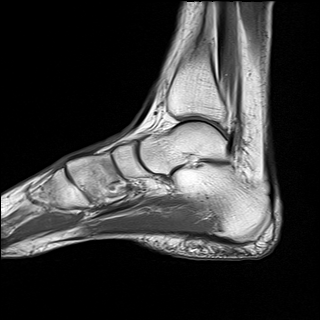
[im 16/20]
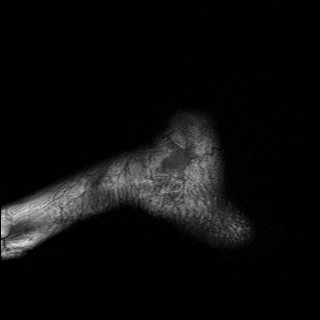
[im 20/20]
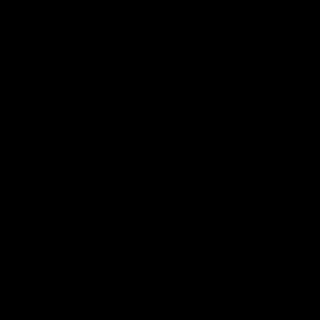

[Series 7: STIR · sagittal · 4.0mm · 0.35mm/px · 3 of 20 slices shown]
[im 1/20]
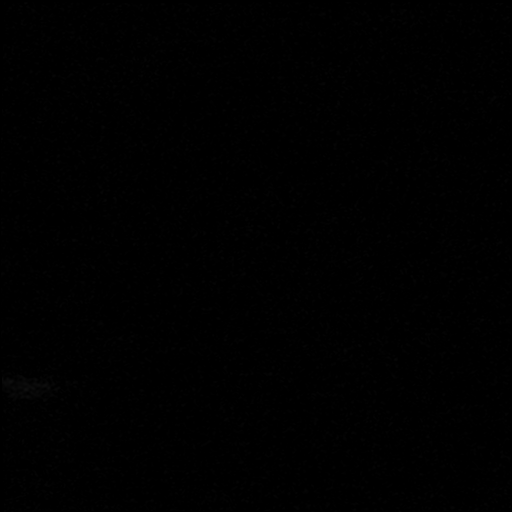
[im 4/20]
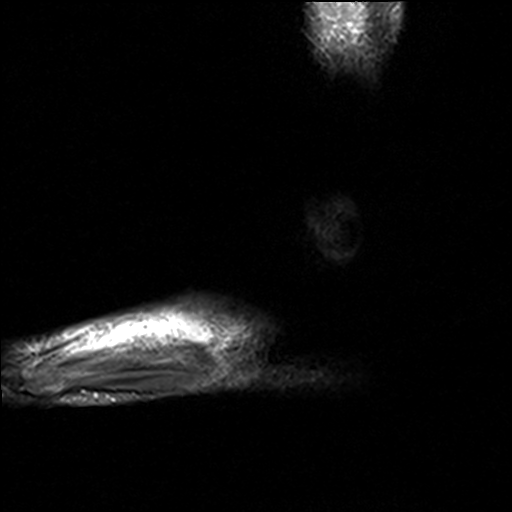
[im 8/20]
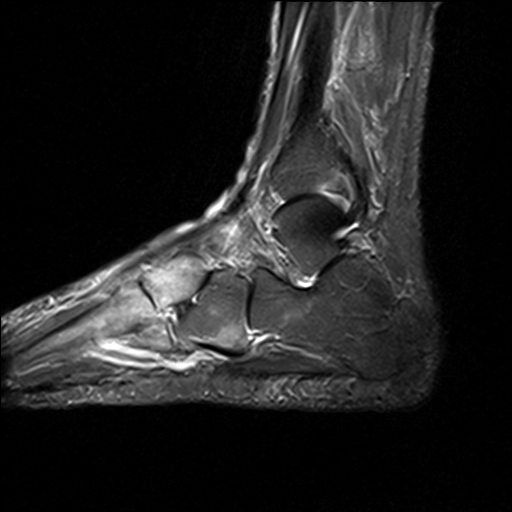

[Series 8: T2 fat-sat · coronal · 3.0mm · 0.50mm/px · 11 of 39 slices shown (2 of 2)]
[im 1/39]
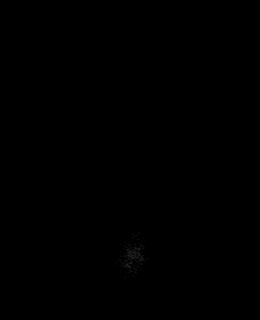
[im 4/39]
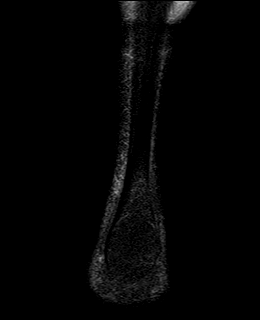
[im 8/39]
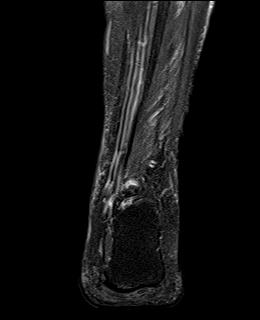
[im 12/39]
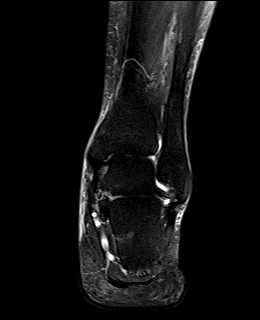
[im 16/39]
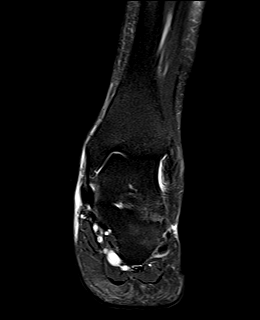
[im 20/39]
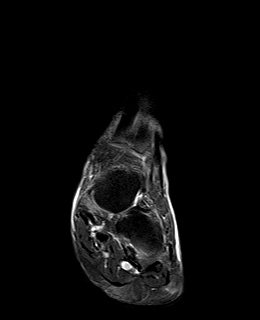
[im 23/39]
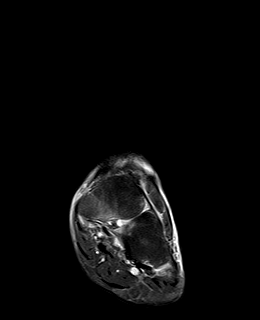
[im 27/39]
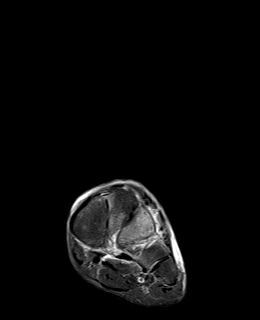
[im 31/39]
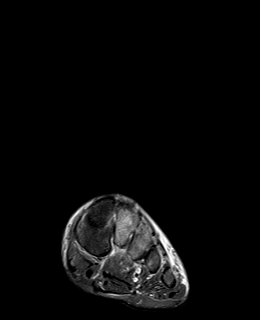
[im 35/39]
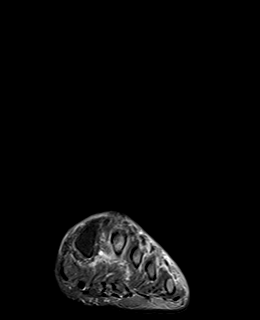
[im 39/39]
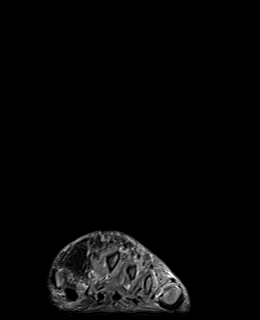

[37 of 40 positions shown; findings below may reference images not displayed]

FINDINGS: TENDONS

Peroneal: Peroneal longus tendon intact. Peroneal brevis intact.

Posteromedial: Posterior tibial tendon intact. Flexor hallucis
longus tendon intact. Flexor digitorum longus tendon intact.

Anterior: Tibialis anterior tendon intact. Extensor hallucis longus
tendon intact Extensor digitorum longus tendon intact.

Achilles:  Intact.

Plantar Fascia: Intact.

LIGAMENTS

Lateral: Anterior talofibular ligament intact. Calcaneofibular
ligament intact. Posterior talofibular ligament intact. Anterior and
posterior tibiofibular ligaments intact.

Medial: Deltoid ligament intact. Spring ligament intact.

CARTILAGE

Ankle Joint: No joint effusion. Normal ankle mortise. No chondral
defect.

Subtalar Joints/Sinus Tarsi: Normal subtalar joints. No subtalar
joint effusion. Normal sinus tarsi.

Bones: Prominent marrow edema within the second and third
metatarsals and middle and lateral cuneiforms. Milder, more patchy
marrow edema involving the fourth metatarsal, medial cuneiform,
navicular, and cuboid. Questionable nondisplaced fracture of the
distal lateral cuneiform. No dislocation. No suspicious bone lesion.

Soft Tissue: No soft tissue mass or fluid collection. Mild soft
tissue swelling along the anterolateral hindfoot and midfoot.
IMPRESSION: 1. Questionable nondisplaced fracture of the lateral cuneiform.
Prominent marrow edema within the second and third metatarsals and
middle cuneiform, with more patchy milder marrow edema involving the
remaining tarsals and fourth metatarsal. This could represent stress
related changes. Given significant osteopenia on x-rays, the
differential diagnosis also includes transient regional migratory
osteoporosis or reflex sympathetic dystrophy.
